# Patient Record
Sex: Male | Born: 1960 | Race: White | Hispanic: No | Marital: Married | Smoking: Former smoker
Health system: Southern US, Community
[De-identification: ages and names within clinical notes are randomized; demographics above are authoritative.]

## PROBLEM LIST (undated history)

## (undated) DIAGNOSIS — I10 Essential (primary) hypertension: Secondary | ICD-10-CM

## (undated) DIAGNOSIS — E119 Type 2 diabetes mellitus without complications: Secondary | ICD-10-CM

## (undated) DIAGNOSIS — I4891 Unspecified atrial fibrillation: Secondary | ICD-10-CM

## (undated) DIAGNOSIS — K219 Gastro-esophageal reflux disease without esophagitis: Secondary | ICD-10-CM

## (undated) DIAGNOSIS — E039 Hypothyroidism, unspecified: Secondary | ICD-10-CM

## (undated) HISTORY — PX: COLONOSCOPY: SHX174

## (undated) HISTORY — PX: UMBILICAL HERNIA REPAIR: SHX196

---

## 2013-04-28 DIAGNOSIS — I4891 Unspecified atrial fibrillation: Secondary | ICD-10-CM

## 2013-04-28 HISTORY — DX: Unspecified atrial fibrillation: I48.91

## 2017-12-13 ENCOUNTER — Emergency Department (HOSPITAL_COMMUNITY)
Admission: EM | Admit: 2017-12-13 | Discharge: 2017-12-14 | Disposition: A | Discharge status: Home / Self Care | Payer: BLUE CROSS/BLUE SHIELD | Attending: Emergency Medicine | Admitting: Emergency Medicine

## 2017-12-13 ENCOUNTER — Encounter (HOSPITAL_COMMUNITY): Payer: Self-pay | Admitting: Emergency Medicine

## 2017-12-13 ENCOUNTER — Emergency Department (HOSPITAL_COMMUNITY): Payer: BLUE CROSS/BLUE SHIELD

## 2017-12-13 ENCOUNTER — Other Ambulatory Visit: Payer: Self-pay

## 2017-12-13 DIAGNOSIS — K222 Esophageal obstruction: Secondary | ICD-10-CM | POA: Diagnosis not present

## 2017-12-13 DIAGNOSIS — K269 Duodenal ulcer, unspecified as acute or chronic, without hemorrhage or perforation: Secondary | ICD-10-CM | POA: Diagnosis not present

## 2017-12-13 DIAGNOSIS — I10 Essential (primary) hypertension: Secondary | ICD-10-CM | POA: Diagnosis not present

## 2017-12-13 DIAGNOSIS — K449 Diaphragmatic hernia without obstruction or gangrene: Secondary | ICD-10-CM | POA: Diagnosis not present

## 2017-12-13 DIAGNOSIS — E119 Type 2 diabetes mellitus without complications: Secondary | ICD-10-CM | POA: Insufficient documentation

## 2017-12-13 DIAGNOSIS — Z87891 Personal history of nicotine dependence: Secondary | ICD-10-CM | POA: Diagnosis not present

## 2017-12-13 DIAGNOSIS — K210 Gastro-esophageal reflux disease with esophagitis: Secondary | ICD-10-CM | POA: Insufficient documentation

## 2017-12-13 DIAGNOSIS — I482 Chronic atrial fibrillation: Secondary | ICD-10-CM | POA: Diagnosis not present

## 2017-12-13 DIAGNOSIS — T18128A Food in esophagus causing other injury, initial encounter: Secondary | ICD-10-CM | POA: Diagnosis not present

## 2017-12-13 DIAGNOSIS — Z880 Allergy status to penicillin: Secondary | ICD-10-CM | POA: Diagnosis not present

## 2017-12-13 DIAGNOSIS — E039 Hypothyroidism, unspecified: Secondary | ICD-10-CM | POA: Diagnosis not present

## 2017-12-13 DIAGNOSIS — K2980 Duodenitis without bleeding: Secondary | ICD-10-CM | POA: Diagnosis not present

## 2017-12-13 DIAGNOSIS — Z7984 Long term (current) use of oral hypoglycemic drugs: Secondary | ICD-10-CM | POA: Insufficient documentation

## 2017-12-13 DIAGNOSIS — T17200A Unspecified foreign body in pharynx causing asphyxiation, initial encounter: Secondary | ICD-10-CM | POA: Diagnosis not present

## 2017-12-13 DIAGNOSIS — Z882 Allergy status to sulfonamides status: Secondary | ICD-10-CM | POA: Insufficient documentation

## 2017-12-13 DIAGNOSIS — R1310 Dysphagia, unspecified: Secondary | ICD-10-CM | POA: Diagnosis not present

## 2017-12-13 HISTORY — DX: Type 2 diabetes mellitus without complications: E11.9

## 2017-12-13 HISTORY — DX: Unspecified atrial fibrillation: I48.91

## 2017-12-13 HISTORY — DX: Hypothyroidism, unspecified: E03.9

## 2017-12-13 HISTORY — DX: Essential (primary) hypertension: I10

## 2017-12-13 HISTORY — DX: Gastro-esophageal reflux disease without esophagitis: K21.9

## 2017-12-13 MED ORDER — DIAZEPAM 5 MG/ML IJ SOLN
2.5000 mg | Freq: Once | INTRAMUSCULAR | Status: AC
  Administered 2017-12-14: 2.5 mg via INTRAVENOUS
  Filled 2017-12-13: qty 2

## 2017-12-13 MED ORDER — GLUCAGON HCL RDNA (DIAGNOSTIC) 1 MG IJ SOLR
1.0000 mg | Freq: Once | INTRAMUSCULAR | Status: AC
Start: 2017-12-14 — End: 2017-12-14
  Administered 2017-12-14: 1 mg via INTRAVENOUS
  Filled 2017-12-13: qty 1

## 2017-12-13 NOTE — ED Provider Notes (Signed)
[facility] EMERGENCY DEPARTMENT Provider Note   CSN: [id number] Arrival date & time: 12/13/17  2200     History   Chief Complaint Chief Complaint  Patient presents with  . Foreign Body    HPI Eric Vaughn is a 57 y.o. male.  Patient believes he has a piece of meat stuck in his esophagus.  This happened around 8 PM.  Denies any choking sensation.  Initially he was not able to swallow his spit but now he is able to.  Did have one episode of forced emesis.  No difficulty breathing or chest pain.  History of this happening in the past but is never required a trip to the hospital.  He is never seen a GI doctor had an endoscopy.  He has a history of A. fib and diabetes but is not on anticoagulation.  Denies any difficulty breathing or swallowing at this time.  Still feels foreign body sensation in his mid chest.  The history is provided by the patient.  Foreign Body  Pertinent negatives include no fever, no congestion, no cough, no chest pain, no vomiting and no abdominal pain.    Past Medical History:  Diagnosis Date  . Atrial fibrillation (HCC) 2015  . Diabetes mellitus without complication (HCC)   . Hypertension     There are no active problems to display for this patient.   History reviewed. No pertinent surgical history.      Home Medications    Prior to Admission medications   Medication Sig Start Date End Date Taking? Authorizing Provider  aspirin 81 MG chewable tablet Chew by mouth daily.   Yes [provider]  lisinopril (PRINIVIL,ZESTRIL) 20 MG tablet Take 20 mg by mouth 2 (two) times daily.   Yes [provider]  metFORMIN (GLUCOPHAGE) 500 MG tablet Take by mouth 3 (three) times daily before meals.   Yes [provider]  metoprolol succinate (TOPROL-XL) 50 MG 24 hr tablet Take 50 mg by mouth 2 (two) times daily. Take with or immediately following a meal.   Yes [provider]  NIFEdipine (PROCARDIA-XL/ADALAT CC) 30 MG 24  hr tablet Take 30 mg by mouth daily.   Yes [provider]  omeprazole (PRILOSEC) 20 MG capsule Take 20 mg by mouth daily as needed.   Yes [provider]  PARoxetine (PAXIL) 10 MG tablet Take 10 mg by mouth daily.   Yes [provider]    Family History History reviewed. No pertinent family history.  Social History Social History   Tobacco Use  . Smoking status: Former Games developer  . Smokeless tobacco: Current User    Types: Snuff  Substance Use Topics  . Alcohol use: Yes    Comment: social   . Drug use: Never     Allergies   Penicillins and Sulfa antibiotics   Review of Systems Review of Systems  Constitutional: Negative for activity change, appetite change and fever.  HENT: Negative for congestion and rhinorrhea.   Respiratory: Negative for cough, chest tightness and shortness of breath.   Cardiovascular: Negative for chest pain.  Gastrointestinal: Negative for abdominal pain, nausea and vomiting.  Genitourinary: Negative for dysuria, hematuria and urgency.  Musculoskeletal: Negative for arthralgias and myalgias.  Skin: Negative for rash.  Neurological: Negative for dizziness, weakness and light-headedness.   all other systems are negative except as noted in the HPI and PMH.    Physical Exam Updated Vital Signs BP (!) 175/95 (BP Location: Right Arm)   Pulse 70  Temp 98.1 F (36.7 C) (Oral)   Resp 18   Ht 6' (1.829 m)   Wt 86.2 kg   SpO2 100%   BMI 25.77 kg/m   Physical Exam  Constitutional: He is oriented to person, place, and time. He appears well-developed and well-nourished. No distress.  No distress, speaking in full sentences  HENT:  Head: Normocephalic and atraumatic.  Mouth/Throat: Oropharynx is clear and moist. No oropharyngeal exudate.  Controlling secretion. No drooling  Eyes: Pupils are equal, round, and reactive to light. Conjunctivae and EOM are normal.  Neck: Normal range of motion. Neck supple.  No meningismus.    Cardiovascular: Normal rate, regular rhythm, normal heart sounds and intact distal pulses.  No murmur heard. Pulmonary/Chest: Effort normal and breath sounds normal. No respiratory distress. He exhibits no tenderness.  Abdominal: Soft. There is no tenderness. There is no rebound and no guarding.  Musculoskeletal: Normal range of motion. He exhibits no edema or tenderness.  Neurological: He is alert and oriented to person, place, and time. No cranial nerve deficit. He exhibits normal muscle tone. Coordination normal.  No ataxia on finger to nose bilaterally. No pronator drift. 5/5 strength throughout. CN 2-12 intact.Equal grip strength. Sensation intact.   Skin: Skin is warm. Capillary refill takes less than 2 seconds. No rash noted.  Psychiatric: He has a normal mood and affect. His behavior is normal.  Nursing note and vitals reviewed.    ED Treatments / Results  Labs (all labs ordered are listed, but only abnormal results are displayed) Labs Reviewed  BASIC METABOLIC PANEL - Abnormal; Notable for the following components:      Result Value   Glucose, Bld 205 (*)    All other components within normal limits  CBC WITH DIFFERENTIAL/PLATELET  TROPONIN I    EKG EKG Interpretation  Date/Time:  Monday December 14 2017 00:29:28 EDT Ventricular Rate:  69 PR Interval:    QRS Duration: 85 QT Interval:  399 QTC Calculation: 428 R Axis:   40 Text Interpretation:  Sinus rhythm Borderline repolarization abnormality No previous ECGs available Confirmed by Glynn Octaveancour, Kaleigh Spiegelman [id number]) on 12/14/2017 12:37:29 AM   Radiology Dg Neck Soft Tissue  Result Date: 12/13/2017 CLINICAL DATA:  Initial evaluation for foreign body in throat. EXAM: NECK SOFT TISSUES - 1+ VIEW COMPARISON:  None. FINDINGS: There is no evidence of retropharyngeal soft tissue swelling or epiglottic enlargement. The cervical airway is unremarkable and no radio-opaque foreign body identified. Degenerative spondylolysis noted at  C5-6. IMPRESSION: Negative.  No radiopaque foreign body identified. Electronically Signed   By: Rise MuBenjamin  McClintock M.D.   On: 12/13/2017 23:36   Dg Chest 2 View  Result Date: 12/13/2017 CLINICAL DATA:  Initial evaluation for possible foreign body in throat. EXAM: CHEST - 2 VIEW COMPARISON:  None. FINDINGS: The cardiac and mediastinal silhouettes are stable in size and contour, and remain within normal limits. No appreciable radiopaque foreign body. No significant esophageal dilatation. The lungs are normally inflated. No airspace consolidation, pleural effusion, or pulmonary edema is identified. There is no pneumothorax. No acute osseous abnormality identified. IMPRESSION: 1. No radiopaque foreign body seen within the thorax. 2. No other active cardiopulmonary disease. Electronically Signed   By: Rise MuBenjamin  McClintock M.D.   On: 12/13/2017 23:34    Procedures Procedures (including critical care time)  Medications Ordered in ED Medications  glucagon (human recombinant) (GLUCAGEN) injection 1 mg (has no administration in time range)  diazepam (VALIUM) injection 2.5 mg (has no administration in time range)  Initial Impression / Assessment and Plan / ED Course  I have reviewed the triage vital signs and the nursing notes.  Pertinent labs & imaging results that were available during my care of the patient were reviewed by me and considered in my medical decision making (see chart for details).  Clinical Course as of Dec 14 809  Mon Dec 14, 2017  [id number] EGD food bolus   [MB]    Clinical Course User Index [MB] Sabas SousBero, Michael M, MD   Patient with possible food impaction.  There is no difficulty swallowing his secretions.  There is no stridor.  He is speaking in full sentences.  X-rays show no radiopaque foreign body. Will attempt glucagon and Valium.  Patient reports no change after glucagon and Valium.  Attempts to swallow liquid which comes back up immediately. He is able to control  his secretions. Labs reviewed and show hyperglycemia is likely due to glucagon use and recent food intake but will need PCP follow-up.  Case discussed with Dr. Jena Gaussourk of gastroenterology who will arrange for EGD later today. Patient resting comfortably with patent airway.  Final Clinical Impressions(s) / ED Diagnoses   Final diagnoses:  Food impaction of esophagus, initial encounter    ED Discharge Orders    None       Swayze Pries, Jeannett SeniorStephen, MD 12/14/17 [phone]

## 2017-12-14 ENCOUNTER — Emergency Department (HOSPITAL_COMMUNITY): Payer: BLUE CROSS/BLUE SHIELD | Admitting: Anesthesiology

## 2017-12-14 ENCOUNTER — Encounter (HOSPITAL_COMMUNITY): Payer: Self-pay | Admitting: Gastroenterology

## 2017-12-14 ENCOUNTER — Encounter (HOSPITAL_COMMUNITY)
Admission: EM | Disposition: A | Discharge status: Home / Self Care | Payer: Self-pay | Source: Home / Self Care | Attending: Emergency Medicine

## 2017-12-14 ENCOUNTER — Other Ambulatory Visit: Payer: Self-pay

## 2017-12-14 DIAGNOSIS — T18128A Food in esophagus causing other injury, initial encounter: Secondary | ICD-10-CM | POA: Diagnosis not present

## 2017-12-14 DIAGNOSIS — K210 Gastro-esophageal reflux disease with esophagitis: Secondary | ICD-10-CM | POA: Diagnosis not present

## 2017-12-14 DIAGNOSIS — K222 Esophageal obstruction: Secondary | ICD-10-CM | POA: Diagnosis not present

## 2017-12-14 DIAGNOSIS — K2980 Duodenitis without bleeding: Secondary | ICD-10-CM | POA: Diagnosis not present

## 2017-12-14 DIAGNOSIS — W44F3XA Food entering into or through a natural orifice, initial encounter: Secondary | ICD-10-CM | POA: Insufficient documentation

## 2017-12-14 DIAGNOSIS — R1310 Dysphagia, unspecified: Secondary | ICD-10-CM

## 2017-12-14 DIAGNOSIS — K269 Duodenal ulcer, unspecified as acute or chronic, without hemorrhage or perforation: Secondary | ICD-10-CM | POA: Diagnosis not present

## 2017-12-14 DIAGNOSIS — K2210 Ulcer of esophagus without bleeding: Secondary | ICD-10-CM | POA: Diagnosis not present

## 2017-12-14 DIAGNOSIS — K449 Diaphragmatic hernia without obstruction or gangrene: Secondary | ICD-10-CM | POA: Diagnosis not present

## 2017-12-14 HISTORY — PX: MALONEY DILATION: SHX5535

## 2017-12-14 HISTORY — PX: ESOPHAGOGASTRODUODENOSCOPY (EGD) WITH PROPOFOL: SHX5813

## 2017-12-14 HISTORY — PX: BIOPSY: SHX5522

## 2017-12-14 LAB — BASIC METABOLIC PANEL
Anion gap: 8 (ref 5–15)
BUN: 17 mg/dL (ref 6–20)
CHLORIDE: 103 mmol/L (ref 98–111)
CO2: 28 mmol/L (ref 22–32)
Calcium: 9.4 mg/dL (ref 8.9–10.3)
Creatinine, Ser: 1.03 mg/dL (ref 0.61–1.24)
GFR calc Af Amer: >60 mL/min (ref >60)
GFR calc non Af Amer: >60 mL/min (ref >60)
Glucose, Bld: 205 mg/dL — ABNORMAL HIGH (ref 70–99)
POTASSIUM: 4.3 mmol/L (ref 3.5–5.1)
SODIUM: 139 mmol/L (ref 135–145)

## 2017-12-14 LAB — CBC WITH DIFFERENTIAL/PLATELET
Basophils Absolute: 0 10*3/uL (ref 0.0–0.1)
Basophils Relative: 0 %
EOS ABS: 0.1 10*3/uL (ref 0.0–0.7)
Eosinophils Relative: 1 %
HEMATOCRIT: 45.4 % (ref 39.0–52.0)
HEMOGLOBIN: 15.7 g/dL (ref 13.0–17.0)
LYMPHS PCT: 20 %
Lymphs Abs: 1.5 10*3/uL (ref 0.7–4.0)
MCH: 31 pg (ref 26.0–34.0)
MCHC: 34.6 g/dL (ref 30.0–36.0)
MCV: 89.7 fL (ref 78.0–100.0)
MONOS PCT: 6 %
Monocytes Absolute: 0.5 10*3/uL (ref 0.1–1.0)
NEUTROS ABS: 5.7 10*3/uL (ref 1.7–7.7)
NEUTROS PCT: 73 %
Platelets: 163 10*3/uL (ref 150–400)
RBC: 5.06 MIL/uL (ref 4.22–5.81)
RDW: 13 % (ref 11.5–15.5)
WBC: 7.8 10*3/uL (ref 4.0–10.5)

## 2017-12-14 LAB — TROPONIN I

## 2017-12-14 LAB — GLUCOSE, CAPILLARY: GLUCOSE-CAPILLARY: 133 mg/dL — AB (ref 70–99)

## 2017-12-14 SURGERY — ESOPHAGOGASTRODUODENOSCOPY (EGD) WITH PROPOFOL
Anesthesia: Monitor Anesthesia Care

## 2017-12-14 MED ORDER — ONDANSETRON HCL 4 MG/2ML IJ SOLN
INTRAMUSCULAR | Status: DC | PRN
  Administered 2017-12-14: 4 mg via INTRAVENOUS

## 2017-12-14 MED ORDER — MEPERIDINE HCL 100 MG/ML IJ SOLN: 6.2500 mg | INTRAMUSCULAR | Status: DC | PRN

## 2017-12-14 MED ORDER — STERILE WATER FOR INJECTION IJ SOLN
INTRAMUSCULAR | Status: AC
  Filled 2017-12-14: qty 10

## 2017-12-14 MED ORDER — LACTATED RINGERS IV SOLN
INTRAVENOUS | Status: DC
  Administered 2017-12-14: 1000 mL via INTRAVENOUS

## 2017-12-14 MED ORDER — MIDAZOLAM HCL 5 MG/5ML IJ SOLN
INTRAMUSCULAR | Status: DC | PRN
  Administered 2017-12-14: 2 mg via INTRAVENOUS

## 2017-12-14 MED ORDER — HYDROMORPHONE HCL 1 MG/ML IJ SOLN: 0.2500 mg | INTRAMUSCULAR | Status: DC | PRN

## 2017-12-14 MED ORDER — LACTATED RINGERS IV SOLN: INTRAVENOUS | Status: DC

## 2017-12-14 MED ORDER — PROPOFOL 500 MG/50ML IV EMUL
INTRAVENOUS | Status: DC | PRN
  Administered 2017-12-14: 135 ug/kg/min via INTRAVENOUS

## 2017-12-14 MED ORDER — PROMETHAZINE HCL 25 MG/ML IJ SOLN: 6.2500 mg | INTRAMUSCULAR | Status: DC | PRN

## 2017-12-14 MED ORDER — HYDROCODONE-ACETAMINOPHEN 7.5-325 MG PO TABS: 1.0000 | ORAL_TABLET | Freq: Once | ORAL | Status: DC | PRN

## 2017-12-14 NOTE — Addendum Note (Signed)
Addendum  created 12/14/17 1000 by Despina HiddenIdacavage, Ronell Boldin J, CRNA   Charge Capture section accepted

## 2017-12-14 NOTE — Transfer of Care (Signed)
Immediate Anesthesia Transfer of Care Note  Patient: Eric Vaughn  Procedure(s) Performed: ESOPHAGOGASTRODUODENOSCOPY (EGD) WITH PROPOFOL (N/A ) MALONEY DILATION BIOPSY  Patient Location: PACU  Anesthesia Type:MAC  Level of Consciousness: drowsy  Airway & Oxygen Therapy: Patient Spontanous Breathing and Patient connected to nasal cannula oxygen  Post-op Assessment: Report given to RN, Post -op Vital signs reviewed and stable and Patient moving all extremities  Post vital signs: Reviewed and stable  Last Vitals:  Vitals Value Taken Time  BP    Temp    Pulse    Resp    SpO2      Last Pain:  Vitals:   12/14/17 0853  TempSrc:   PainSc: 0-No pain         Complications: No apparent anesthesia complications

## 2017-12-14 NOTE — Discharge Instructions (Signed)
EGD Discharge instructions Please read the instructions outlined below and refer to this sheet in the next few weeks. These discharge instructions provide you with general information on caring for yourself after you leave the hospital. Your doctor may also give you specific instructions. While your treatment has been planned according to the most current medical practices available, unavoidable complications occasionally occur. If you have any problems or questions after discharge, please call your doctor. ACTIVITY  You may resume your regular activity but move at a slower pace for the next 24 hours.   Take frequent rest periods for the next 24 hours.   Walking will help expel (get rid of) the air and reduce the bloated feeling in your abdomen.   No driving for 24 hours (because of the anesthesia (medicine) used during the test).   You may shower.   Do not sign any important legal documents or operate any machinery for 24 hours (because of the anesthesia used during the test).  NUTRITION  Drink plenty of fluids.   You may resume your normal diet.   Begin with a light meal and progress to your normal diet.   Avoid alcoholic beverages for 24 hours or as instructed by your caregiver.  MEDICATIONS  You may resume your normal medications unless your caregiver tells you otherwise.  WHAT YOU CAN EXPECT TODAY  You may experience abdominal discomfort such as a feeling of fullness or gas pains.  FOLLOW-UP  Your doctor will discuss the results of your test with you.  SEEK IMMEDIATE MEDICAL ATTENTION IF ANY OF THE FOLLOWING OCCUR:  Excessive nausea (feeling sick to your stomach) and/or vomiting.   Severe abdominal pain and distention (swelling).   Trouble swallowing.   Temperature over 101 F (37.8 C).   Rectal bleeding or vomiting of blood.   GERD information provided  Stop omeprazole;  begin Protonix 40 mg twice daily  Office visit with [place] in 3 months    Further  recommendations to follow pending review of pathology report    Gastroesophageal Reflux Disease, Adult Normally, food travels down the esophagus and stays in the stomach to be digested. If a person has gastroesophageal reflux disease (GERD), food and stomach acid move back up into the esophagus. When this happens, the esophagus becomes sore and swollen (inflamed). Over time, GERD can make small holes (ulcers) in the lining of the esophagus. Follow these instructions at home: Diet  Follow a diet as told by your doctor. You may need to avoid foods and drinks such as: ? Coffee and tea (with or without caffeine). ? Drinks that contain alcohol. ? Energy drinks and sports drinks. ? Carbonated drinks or sodas. ? Chocolate and cocoa. ? Peppermint and mint flavorings. ? Garlic and onions. ? Horseradish. ? Spicy and acidic foods, such as peppers, chili powder, curry powder, vinegar, hot sauces, and BBQ sauce. ? Citrus fruit juices and citrus fruits, such as oranges, lemons, and limes. ? Tomato-based foods, such as red sauce, chili, salsa, and pizza with red sauce. ? Fried and fatty foods, such as donuts, french fries, potato chips, and high-fat dressings. ? High-fat meats, such as hot dogs, rib eye steak, sausage, ham, and bacon. ? High-fat dairy items, such as whole milk, butter, and cream cheese.  Eat small meals often. Avoid eating large meals.  Avoid drinking large amounts of liquid with your meals.  Avoid eating meals during the 2-3 hours before bedtime.  Avoid lying down right after you eat.  Do not exercise  right after you eat. General instructions  Pay attention to any changes in your symptoms.  Take over-the-counter and prescription medicines only as told by your doctor. Do not take aspirin, ibuprofen, or other NSAIDs unless your doctor says it is okay.  Do not use any tobacco products, including cigarettes, chewing tobacco, and e-cigarettes. If you need help quitting, ask your  doctor.  Wear loose clothes. Do not wear anything tight around your waist.  Raise (elevate) the head of your bed about 6 inches (15 cm).  Try to lower your stress. If you need help doing this, ask your doctor.  If you are overweight, lose an amount of weight that is healthy for you. Ask your doctor about a safe weight loss goal.  Keep all follow-up visits as told by your doctor. This is important. Contact a doctor if:  You have new symptoms.  You lose weight and you do not know why it is happening.  You have trouble swallowing, or it hurts to swallow.  You have wheezing or a cough that keeps happening.  Your symptoms do not get better with treatment.  You have a hoarse voice. Get help right away if:  You have pain in your arms, neck, jaw, teeth, or back.  You feel sweaty, dizzy, or light-headed.  You have chest pain or shortness of breath.  You throw up (vomit) and your throw up looks like blood or coffee grounds.  You pass out (faint).  Your poop (stool) is bloody or black.  You cannot swallow, drink, or eat. This information is not intended to replace advice given to you by your health care provider. Make sure you discuss any questions you have with your health care provider. Document Released: 10/01/2007 Document Revised: 09/20/2015 Document Reviewed: 08/09/2014 Elsevier Interactive Patient Education  2018 Elsevier Inc[employer]     Monitored Anesthesia Care, Care After These instructions provide you with information about caring for yourself after your procedure. Your health care provider may also give you more specific instructions. Your treatment has been planned according to current medical practices, but problems sometimes occur. Call your health care provider if you have any problems or questions after your procedure. What can I expect after the procedure? After your procedure, it is common to:  Feel sleepy for several hours.  Feel clumsy and have poor balance  for several hours.  Feel forgetful about what happened after the procedure.  Have poor judgment for several hours.  Feel nauseous or vomit.  Have a sore throat if you had a breathing tube during the procedure.  Follow these instructions at home: For at least 24 hours after the procedure:   Do not: ? Participate in activities in which you could fall or become injured. ? Drive. ? Use heavy machinery. ? Drink alcohol. ? Take sleeping pills or medicines that cause drowsiness. ? Make important decisions or sign legal documents. ? Take care of children on your own.  Rest. Eating and drinking  Follow the diet that is recommended by your health care provider.  If you vomit, drink water, juice, or soup when you can drink without vomiting.  Make sure you have little or no nausea before eating solid foods. General instructions  Have a responsible adult stay with you until you are awake and alert.  Take over-the-counter and prescription medicines only as told by your health care provider.  If you smoke, do not smoke without supervision.  Keep all follow-up visits as told by your health care provider.  This is important. Contact a health care provider if:  You keep feeling nauseous or you keep vomiting.  You feel light-headed.  You develop a rash.  You have a fever. Get help right away if:  You have trouble breathing. This information is not intended to replace advice given to you by your health care provider. Make sure you discuss any questions you have with your health care provider. Document Released: 08/05/2015 Document Revised: 12/05/2015 Document Reviewed: 08/05/2015 Elsevier Interactive Patient Education  [employer]2018 [employer] Inc.

## 2017-12-14 NOTE — Anesthesia Preprocedure Evaluation (Signed)
Anesthesia Evaluation  Patient identified by MRN, date of birth, ID band Patient awake    Reviewed: Allergy & Precautions, H&P , NPO status , Patient's Chart, lab work & pertinent test results, reviewed documented beta blocker date and time   Airway Mallampati: II  TM Distance: >3 FB Neck ROM: full    Dental no notable dental hx. (+) Teeth Intact   Pulmonary neg pulmonary ROS, former smoker,    Pulmonary exam normal breath sounds clear to auscultation       Cardiovascular Exercise Tolerance: Good hypertension, negative cardio ROS   Rhythm:regular Rate:Normal     Neuro/Psych negative neurological ROS  negative psych ROS   GI/Hepatic negative GI ROS, Neg liver ROS, GERD  ,  Endo/Other  negative endocrine ROSdiabetesHypothyroidism   Renal/GU negative Renal ROS  negative genitourinary   Musculoskeletal   Abdominal   Peds  Hematology negative hematology ROS (+)   Anesthesia Other Findings Posted emergency for food bolus. Seen in no distress, not drooling, swallows, pleasant and converses normally  Reproductive/Obstetrics negative OB ROS                             Anesthesia Physical Anesthesia Plan  ASA: II and emergent  Anesthesia Plan: MAC   Post-op Pain Management:    Induction:   PONV Risk Score and Plan:   Airway Management Planned:   Additional Equipment:   Intra-op Plan:   Post-operative Plan:   Informed Consent: I have reviewed the patients History and Physical, chart, labs and discussed the procedure including the risks, benefits and alternatives for the proposed anesthesia with the patient or authorized representative who has indicated his/her understanding and acceptance.   Dental Advisory Given  Plan Discussed with: CRNA and Anesthesiologist  Anesthesia Plan Comments:         Anesthesia Quick Evaluation

## 2017-12-14 NOTE — Anesthesia Postprocedure Evaluation (Signed)
Anesthesia Post Note  Patient: Futures trader  Procedure(s) Performed: ESOPHAGOGASTRODUODENOSCOPY (EGD) WITH PROPOFOL (N/A ) [place]  Patient location during evaluation: PACU Anesthesia Type: MAC Level of consciousness: awake and alert and patient cooperative Pain management: pain level controlled Vital Signs Assessment: post-procedure vital signs reviewed and stable Respiratory status: spontaneous breathing, nonlabored ventilation and respiratory function stable Cardiovascular status: blood pressure returned to baseline Postop Assessment: no apparent nausea or vomiting Anesthetic complications: no     Last Vitals:  Vitals:   12/14/17 0845 12/14/17 0920  BP:  139/87  Pulse:  72  Resp: 13 12  Temp:  (P) 36.5 C  SpO2: 99% 100%    Last Pain:  Vitals:   12/14/17 0853  TempSrc:   PainSc: 0-No pain                 Mikaylee Arseneau J

## 2017-12-14 NOTE — H&P (Signed)
Referring Provider: Dr. Manus Gunningancour, [facility] Primary Care Physician:  Garald BraverElliott, Dianne E Primary Gastroenterologist:  Previously Dr. Elder CyphersShiflett. Desires to transfer care to Dr. Jena Gaussourk.   HPI:  Eric Vaughn is a 57 y.o. year old male presenting to the ED yesterday evening with food impaction. Notes cooking on the grill yesterday evening with son and swallowed a large piece of pork rib. Knew immediately it was lodged. Initially was not able to tolerate secretions but received glucagon and Valium in the ED. Now tolerating secretions but unable to tolerate water. Negative soft tissue xray. Negative CXR.   Feels food lodged in lower chest. Wife states he has felt solid food hung before and has to walk around, jump around, passes on own. States never regurgitated before. Wife states he takes Tums every night but. Omeprazole once or twice a month. Chronic GERD with intermittent vague solid food dysphagia. Not on daily PPI.   No prior EGD. Numerous colonoscopies by Dr. Elder CyphersShiflett.due to family history of colon cancer in father, who passed away age 58. Last colonoscopy approximately 1-2 years ago.    Hx of afib, no anticoagulation. On 81 mg aspirin.   Past Medical History:  Diagnosis Date  . Atrial fibrillation (HCC) 2015  . Diabetes mellitus without complication (HCC)   . GERD (gastroesophageal reflux disease)   . Hypertension   . Hypothyroidism     Past Surgical History:  Procedure Laterality Date  . COLONOSCOPY     multiple due to family history of colon cancer. Dr. Elder CyphersShiflett. colonoscopy possibly around 2017//2018      Synthroid 25 mcg daily   Prior to Admission medications   Medication Sig Start Date End Date Taking? Authorizing Provider  aspirin 81 MG chewable tablet Chew by mouth daily.   Yes [provider]  lisinopril (PRINIVIL,ZESTRIL) 20 MG tablet Take 20 mg by mouth 2 (two) times daily.   Yes [provider]  metFORMIN (GLUCOPHAGE) 500 MG tablet Take by mouth 3  (three) times daily before meals.   Yes [provider]  metoprolol succinate (TOPROL-XL) 50 MG 24 hr tablet Take 50 mg by mouth 2 (two) times daily. Take with or immediately following a meal.   Yes [provider]  NIFEdipine (PROCARDIA-XL/ADALAT CC) 30 MG 24 hr tablet Take 30 mg by mouth daily.   Yes [provider]  omeprazole (PRILOSEC) 20 MG capsule Take 20 mg by mouth daily as needed.   Yes [provider]  PARoxetine (PAXIL) 10 MG tablet Take 10 mg by mouth daily.   Yes [provider]    No current facility-administered medications for this encounter.    Current Outpatient Medications  Medication Sig Dispense Refill  . aspirin 81 MG chewable tablet Chew by mouth daily.    Marland Kitchen. lisinopril (PRINIVIL,ZESTRIL) 20 MG tablet Take 20 mg by mouth 2 (two) times daily.    . metFORMIN (GLUCOPHAGE) 500 MG tablet Take by mouth 3 (three) times daily before meals.    . metoprolol succinate (TOPROL-XL) 50 MG 24 hr tablet Take 50 mg by mouth 2 (two) times daily. Take with or immediately following a meal.    . NIFEdipine (PROCARDIA-XL/ADALAT CC) 30 MG 24 hr tablet Take 30 mg by mouth daily.    Marland Kitchen. omeprazole (PRILOSEC) 20 MG capsule Take 20 mg by mouth daily as needed.    Marland Kitchen. PARoxetine (PAXIL) 10 MG tablet Take 10 mg by mouth daily.      Allergies as of 12/13/2017 - Review Complete 12/13/2017  Allergen Reaction Noted  . Penicillins Rash 12/13/2017  . Sulfa antibiotics Rash 12/13/2017    Family History  Problem Relation Age of Onset  . Colon cancer Father        deceased age 58    Social History   Socioeconomic History  . Marital status: Married    Spouse name: Not on file  . Number of children: Not on file  . Years of education: Not on file  . Highest education level: Not on file  Occupational History  . Not on file  Social Needs  . Financial resource strain: Not on file  . Food insecurity:    Worry: Not on file    Inability: Not on file  .  Transportation needs:    Medical: Not on file    Non-medical: Not on file  Tobacco Use  . Smoking status: Former Games developermoker  . Smokeless tobacco: Current User    Types: Snuff  Substance and Sexual Activity  . Alcohol use: Yes    Comment: social   . Drug use: Never  . Sexual activity: Not on file  Lifestyle  . Physical activity:    Days per week: Not on file    Minutes per session: Not on file  . Stress: Not on file  Relationships  . Social connections:    Talks on phone: Not on file    Gets together: Not on file    Attends religious service: Not on file    Active member of club or organization: Not on file    Attends meetings of clubs or organizations: Not on file    Relationship status: Not on file  . Intimate partner violence:    Fear of current or ex partner: Not on file    Emotionally abused: Not on file    Physically abused: Not on file    Forced sexual activity: Not on file  Other Topics Concern  . Not on file  Social History Narrative  . Not on file    Review of Systems: Gen: Denies fever, chills, loss of appetite, change in weight or weight loss CV: Denies chest pain, heart palpitations, syncope, edema  Resp: Denies shortness of breath with rest, cough, wheezing GI: see HPI  GU : Denies urinary burning, urinary frequency, urinary incontinence.  MS: Denies joint pain,swelling, cramping Derm: Denies rash, itching, dry skin Psych: Denies depression, anxiety,confusion, or memory loss Heme: Denies bruising, bleeding, and enlarged lymph nodes.  Physical Exam: Vital signs in last 24 hours: Temp:  [98.1 F (36.7 C)] 98.1 F (36.7 C) (08/18 2221) Pulse Rate:  [42-74] 62 (08/19 0600) Resp:  [11-21] 19 (08/19 0600) BP: (137-175)/(78-98) 175/98 (08/19 0600) SpO2:  [93 %-100 %] 99 % (08/19 0600) Weight:  [86.2 kg] 86.2 kg (08/18 2221)   General:   Alert,  Well-developed, well-nourished, pleasant and cooperative in NAD Head:  Normocephalic and atraumatic. Eyes:   Sclera clear, no icterus.   Conjunctiva pink. Ears:  Normal auditory acuity. Nose:  No deformity, discharge,  or lesions. Mouth:  No deformity or lesions, dentition normal. Lungs:  Clear throughout to auscultation.   No wheezes, crackles, or rhonchi. No acute distress. Heart:  S1 S2 present, irregularly irregular, no murmurs Abdomen:  Soft, nontender and nondistended. No masses, hepatosplenomegaly or hernias noted. Normal bowel sounds, without guarding, and without rebound.   Rectal:  Deferred  Msk:  Symmetrical without gross deformities. Normal posture. Extremities:  Without edema. Neurologic:  Alert and  oriented x4 Psych:  Alert and cooperative. Normal mood and affect.  Intake/Output from previous day: No intake/output data recorded. Intake/Output this shift: No intake/output data recorded.  Lab Results: Recent Labs    12/14/17 0108  WBC 7.8  HGB 15.7  HCT 45.4  PLT 163   BMET Recent Labs    12/14/17 0108  NA 139  K 4.3  CL 103  CO2 28  GLUCOSE 205*  BUN 17  CREATININE 1.03  CALCIUM 9.4    Studies/Results: Dg Neck Soft Tissue  Result Date: 12/13/2017 CLINICAL DATA:  Initial evaluation for foreign body in throat. EXAM: NECK SOFT TISSUES - 1+ VIEW COMPARISON:  None. FINDINGS: There is no evidence of retropharyngeal soft tissue swelling or epiglottic enlargement. The cervical airway is unremarkable and no radio-opaque foreign body identified. Degenerative spondylolysis noted at C5-6. IMPRESSION: Negative.  No radiopaque foreign body identified. Electronically Signed   By: Rise Mu M.D.   On: 12/13/2017 23:36   Dg Chest 2 View  Result Date: 12/13/2017 CLINICAL DATA:  Initial evaluation for possible foreign body in throat. EXAM: CHEST - 2 VIEW COMPARISON:  None. FINDINGS: The cardiac and mediastinal silhouettes are stable in size and contour, and remain within normal limits. No appreciable radiopaque foreign body. No significant esophageal dilatation. The  lungs are normally inflated. No airspace consolidation, pleural effusion, or pulmonary edema is identified. There is no pneumothorax. No acute osseous abnormality identified. IMPRESSION: 1. No radiopaque foreign body seen within the thorax. 2. No other active cardiopulmonary disease. Electronically Signed   By: Rise Mu M.D.   On: 12/13/2017 23:34    Assessment: 57 year old male with history of chronic GERD, intermittent vague solid food dysphagia, now presenting with food impaction after eating large piece of pork rib last night. Prior to ED presentation was not tolerating oral secretions; however, he now is tolerating secretions and in no distress. Glucagon and Valium given in ED. Xrays negative for foreign body. Unable to tolerate water. No prior EGD. No daily use of PPI.   EGD/dilation with Propofol arranged ASAP for this morning with Dr. Jena Gauss. I discussed in detail risks and benefits with patient and wife at bedside. As of note, no chronic anticoagulation. 81 mg aspirin daily.   Plan: Remain NPO EGD/dilation with Propofol by Dr. Jena Gauss. Desires to transfer care to [facility].   Gelene Mink, PhD, ANP-BC [facility]     LOS: 0 days    12/14/2017, 8:04 AM

## 2017-12-14 NOTE — Op Note (Signed)
[facility] Patient Name: Eric Vaughn Procedure Date: 12/14/2017 8:16 AM MRN: [id number] Date of Birth: [birth year] Attending MD: Gennette Pacobert Michael Rourk , MD CSN: [id number] Age: 57 Admit Type: Outpatient Procedure:                Upper GI endoscopy Indications:              Dysphagia/ possible food impaction Providers:                Gennette Pacobert Michael Rourk, MD, Loma MessingLurae B. Patsy LagerAlbert RN, RN,                            Burke Keelsrisann Tilley, Technician Referring MD:              Medicines:                Propofol per Anesthesia Complications:            No immediate complications. Estimated Blood Loss:     Estimated blood loss was minimal. Procedure:                Pre-Anesthesia Assessment:                           - Prior to the procedure, a History and Physical                            was performed, and patient medications and                            allergies were reviewed. The patient's tolerance of                            previous anesthesia was also reviewed. The risks                            and benefits of the procedure and the sedation                            options and risks were discussed with the patient.                            All questions were answered, and informed consent                            was obtained. Prior Anticoagulants: The patient has                            taken no previous anticoagulant or antiplatelet                            agents. ASA Grade Assessment: II - A patient with                            mild systemic disease. After reviewing the risks  and benefits, the patient was deemed in                            satisfactory condition to undergo the procedure.                           After obtaining informed consent, the endoscope was                            passed under direct vision. Throughout the                            procedure, the patient's blood pressure, pulse, and           oxygen saturations were monitored continuously. The                            [id number]-H190 ([id number]) scope was introduced through the                            and advanced to the second part of duodenum. The                            upper GI endoscopy was accomplished without                            difficulty. The patient tolerated the procedure                            well. 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       Tubular esophagus remain patent throughout its course. 2 broad tongues       of salmon-colored epithelium. No nodularity / tumor seen. A small hiatal       hernia was present.      No other significant abnormalities were identified in a careful       examination of the stomach.      multiple of bulbar NG tube esions. One erosion a good 2 mm x 8 mm in       dimensions. No oinfiltrative process.. 56 French Maloney dilator passed       full insertion with mild resistance. Look back revealed stricture had       been dilated without apparent clication. Estimateood loss?"minimal.       finally, biopsies of the distal esophag and duodenumaken for histologic       study Impression:               - Erosive/ulcerative reflux esophagitis. Mild                            peptic stricture?"status post dilation. Abnormal                            distal esophagus suspicious for short segment                            ?"  status post biopsy.                           Small hiatal hernia.                           - Duodenal erosion. status post biopy ; cannot                            excludod impaction.                           - Moderate Sedation:      Moderate (conscious) sedation was personally administered by an       anesthesia professional. The following parameters were monitored: oxygen       saturation, heart rate,  blood pressure, respiratory rate, EKG, adequacy       of pulmonary ventilation, and response to care. Total physician       intraservice time was 16 minutes. Recommendation:           - Patient has a contact number available for                            emergencies. The signs and symptoms of potential                            delayed complications were discussed with the                            patient. Return to normal activities tomorrow.                            Written discharge instructions were provided to the                            patient.                           - Resume previous diet.                           - Continue present medications. stop "when                            necessary" omeprazole; Protonix 40 mg orally twice                            daily                           - Await pathology results.                           - No repeat upper endoscopy.                           - Return to GI office in 12 weeks. Procedure Code(s):        ---  Professional ---                           [phone], Esophagogastroduodenoscopy, flexible,                            transoral; diagnostic, including collection of                            specimen(s) by brushing or washing, when performed                            (separate procedure) Diagnosis Code(s):        --- Professional ---                           K44.9, Diaphragmatic hernia without obstruction or                            gangrene                           K26.9, Duodenal ulcer, unspecified as acute or                            chronic, without hemorrhage or perforation                           R13.10, Dysphagia, unspecified CPT copyright 2017 American Medical Association. All rights reserved. The codes documented in this report are preliminary and upon coder review may  be revised to meet current compliance requirements. Gerrit Friendsobert M. Rourk, MD Gennette Pacobert Michael Rourk, MD 12/14/2017 9:23:36 AM This  report has been signed electronically. Number of Addenda: 0

## 2017-12-14 NOTE — ED Notes (Signed)
Consent form signed and pt taken to short stay at this time by ED staff

## 2017-12-14 NOTE — OR Nursing (Signed)
  December 14, 2017  Patient: Eric Vaughn  Date of Birth: [birth year]  Date of Visit: 12/13/2017    To Whom It May Concern:  Eric Vaughn was seen and treated in our emergency department or urgent care center  [facility] on 12/13/2017. Eric Vaughn  may return to work on 12/15/17.  Sincerely,

## 2017-12-21 ENCOUNTER — Encounter: Payer: Self-pay | Admitting: Internal Medicine

## 2017-12-24 ENCOUNTER — Encounter (HOSPITAL_COMMUNITY): Payer: Self-pay | Admitting: Internal Medicine

## 2018-03-19 ENCOUNTER — Ambulatory Visit (INDEPENDENT_AMBULATORY_CARE_PROVIDER_SITE_OTHER): Payer: BLUE CROSS/BLUE SHIELD | Admitting: Gastroenterology

## 2018-03-19 ENCOUNTER — Encounter: Payer: Self-pay | Admitting: Gastroenterology

## 2018-03-19 VITALS — BP 158/94 | HR 91 | Temp 97.1°F | Ht 72.0 in | Wt 199.4 lb

## 2018-03-19 DIAGNOSIS — T18128D Food in esophagus causing other injury, subsequent encounter: Secondary | ICD-10-CM

## 2018-03-19 DIAGNOSIS — K2100 Gastro-esophageal reflux disease with esophagitis, without bleeding: Secondary | ICD-10-CM

## 2018-03-19 DIAGNOSIS — K219 Gastro-esophageal reflux disease without esophagitis: Secondary | ICD-10-CM | POA: Insufficient documentation

## 2018-03-19 NOTE — Progress Notes (Signed)
Primary Care Physician: Garald BraverElliott, Dianne E  Primary Gastroenterologist:  Roetta SessionsMichael Rourk, MD   Chief Complaint  Patient presents with  . food impaction    pp f/u; doing ok    HPI: Eric Vaughn is a 57 y.o. male here for follow-up of recent food impaction.  He was seen on August 19 presenting to the ED with food impaction.  Has happened multiple times in the past as well.  Multiple bulbar erosions.  Pathology revealed inflammation in the esophagus and no Barrett's or tumor or H. pylori.  Patient takes pantoprazole 40 mg twice daily.  He admits that he was taking Prilosec about once per month before.  He has been feeling very well.  Dysphagia completely 100% improved.  No heartburn symptoms.  No abdominal pain.  Bowel function normal.  No blood in stool or melena.  He believes his last colonoscopy was in 2017, [place] in [place].  Advised to come back in 5 years due to family history of colon cancer.  He will try to obtain practice information so that we can get the records.  Current Outpatient Medications  Medication Sig Dispense Refill  . aspirin 81 MG chewable tablet Chew by mouth daily.    Marland Kitchen. levothyroxine (SYNTHROID, LEVOTHROID) 25 MCG tablet Take 1 tablet by mouth daily.    Marland Kitchen. lisinopril (PRINIVIL,ZESTRIL) 20 MG tablet Take 20 mg by mouth 2 (two) times daily.    . metFORMIN (GLUCOPHAGE) 500 MG tablet Take by mouth 3 (three) times daily before meals.    . metoprolol succinate (TOPROL-XL) 50 MG 24 hr tablet Take 50 mg by mouth 2 (two) times daily. Take with or immediately following a meal.    . NIFEdipine (PROCARDIA-XL/ADALAT CC) 30 MG 24 hr tablet Take 30 mg by mouth daily.    . pantoprazole (PROTONIX) 40 MG tablet Take 40 mg by mouth 2 (two) times daily.  3  . PARoxetine (PAXIL) 10 MG tablet Take 10 mg by mouth daily.     No current facility-administered medications for this visit.     Allergies as of 03/19/2018 - Review Complete 03/19/2018  Allergen Reaction Noted    . Penicillins Rash 12/13/2017  . Sulfa antibiotics Rash 12/13/2017    ROS:  General: Negative for anorexia, weight loss, fever, chills, fatigue, weakness. ENT: Negative for hoarseness, difficulty swallowing , nasal congestion. CV: Negative for chest pain, angina, palpitations, dyspnea on exertion, peripheral edema.  Respiratory: Negative for dyspnea at rest, dyspnea on exertion, cough, sputum, wheezing.  GI: See history of present illness. GU:  Negative for dysuria, hematuria, urinary incontinence, urinary frequency, nocturnal urination.  Endo: Negative for unusual weight change.    Physical Examination:   BP (!) 158/94   Pulse 91   Temp (!) 97.1 F (36.2 C) (Oral)   Ht 6' (1.829 m)   Wt 199 lb 6.4 oz (90.4 kg)   BMI 27.04 kg/m   General: Well-nourished, well-developed in no acute distress.  Eyes: No icterus. Mouth: Oropharyngeal mucosa moist and pink , no lesions erythema or exudate. Lungs: Clear to auscultation bilaterally.  Heart: Regular rate and rhythm, no murmurs rubs or gallops.  Abdomen: Bowel sounds are normal, nontender, nondistended, no hepatosplenomegaly or masses, no abdominal bruits or hernia , no rebound or guarding.   Extremities: No lower extremity edema. No clubbing or deformities. Neuro: Alert and oriented x 4   Skin: Warm and dry, no jaundice.   Psych: Alert and cooperative, normal mood and affect.

## 2018-03-19 NOTE — Patient Instructions (Signed)
1. Call back in with information regarding where your last colonoscopy was done so we can get your records.  2. Continue pantoprazole ONCE daily before breakfast. If you start having breakthrough symptoms, you can go back to twice daily.  3. Return to the office in two years or call sooner if needed.

## 2018-03-19 NOTE — Assessment & Plan Note (Signed)
57 year old gentleman with recent EGD for food impaction, found to have erosive/ulcerative reflux esophagitis with soft peptic stricture status post dilation.  Duodenitis as well.  Clinically he is doing much better.  Dysphagia resolved.  No reflux symptoms.  He will continue pantoprazole at 40 mg daily.  If he has any breakthrough reflux we can always go back to twice daily if needed.  Return in 2 years for follow-up.  He will obtain practice information from [place] facility where he had his last colonoscopy.  Try to retrieve records to determine when next colonoscopy is due.  He believes he is due in 2022.

## 2018-03-22 NOTE — Progress Notes (Signed)
CC'D TO PCP °

## 2018-04-12 ENCOUNTER — Telehealth: Payer: Self-pay | Admitting: *Deleted

## 2018-04-12 DIAGNOSIS — K2100 Gastro-esophageal reflux disease with esophagitis, without bleeding: Secondary | ICD-10-CM

## 2018-04-12 NOTE — Telephone Encounter (Signed)
Received refill request for pantoprazole 40mg  1 tablet BID

## 2018-04-14 MED ORDER — PANTOPRAZOLE SODIUM 40 MG PO TBEC: 40.0000 mg | DELAYED_RELEASE_TABLET | Freq: Two times a day (BID) | ORAL | 3 refills | Status: DC

## 2018-04-14 NOTE — Addendum Note (Signed)
Addended by: Delane GingerGILL, ERIC A on: 04/14/2018 04:31 PM   Modules accepted: Orders

## 2018-04-14 NOTE — Telephone Encounter (Signed)
Rx sent per request. 

## 2018-04-26 ENCOUNTER — Telehealth: Payer: Self-pay | Admitting: Internal Medicine

## 2018-04-26 DIAGNOSIS — K2100 Gastro-esophageal reflux disease with esophagitis, without bleeding: Secondary | ICD-10-CM

## 2018-04-26 MED ORDER — PANTOPRAZOLE SODIUM 40 MG PO TBEC: 40.0000 mg | DELAYED_RELEASE_TABLET | Freq: Two times a day (BID) | ORAL | 3 refills | Status: DC

## 2018-04-26 NOTE — Telephone Encounter (Signed)
Please see pharmacy change. Pt would like a refill.

## 2018-04-26 NOTE — Telephone Encounter (Signed)
90 day supply of pantoprozole needs to be sent to [facility] in [place]   Not going to [facility] anymore

## 2018-04-26 NOTE — Telephone Encounter (Signed)
Completed.

## 2018-04-26 NOTE — Addendum Note (Signed)
Addended by: Gelene MinkBOONE, ANNA W on: 04/26/2018 02:07 PM   Modules accepted: Orders

## 2018-07-07 ENCOUNTER — Telehealth: Payer: Self-pay | Admitting: Gastroenterology

## 2018-07-07 NOTE — Telephone Encounter (Signed)
Spoke with pt. He is going to call back when he has the info. Pt forgot to research info. Waiting on a return call.

## 2018-07-07 NOTE — Telephone Encounter (Signed)
Patient was supposed to call in with information regarding where his last colonoscopy was done or doctor's name in [place], [place] so we can get copy and determine when next one is due. Please follow up with him and try to get report.

## 2018-08-26 NOTE — Telephone Encounter (Signed)
Received records from prior colonoscopy dated January 2018  Impression diverticulosis of the sigmoid colon and at the a sending colon.  No specimens collected.  Recommended 5-year follow-up colonoscopy due to family history, father with colon cancer before the age of 64.  Please let patient know that he is due for colonoscopy in January 2023.    Please NIC colonoscopy in January 2023

## 2018-08-30 NOTE — Telephone Encounter (Signed)
ON RECALL  °

## 2018-08-30 NOTE — Telephone Encounter (Signed)
Pt notified of next TCS 04/2021. Please NIC TCS 04/2021 per LSL.

## 2019-07-10 IMAGING — DX DG NECK SOFT TISSUE
2 series · 2 of 2 positions shown · non-contrast
Comparison: None.

CLINICAL DATA: Initial evaluation for foreign body in throat.

EXAM:
NECK SOFT TISSUES - 1+ VIEW

[neck lat]
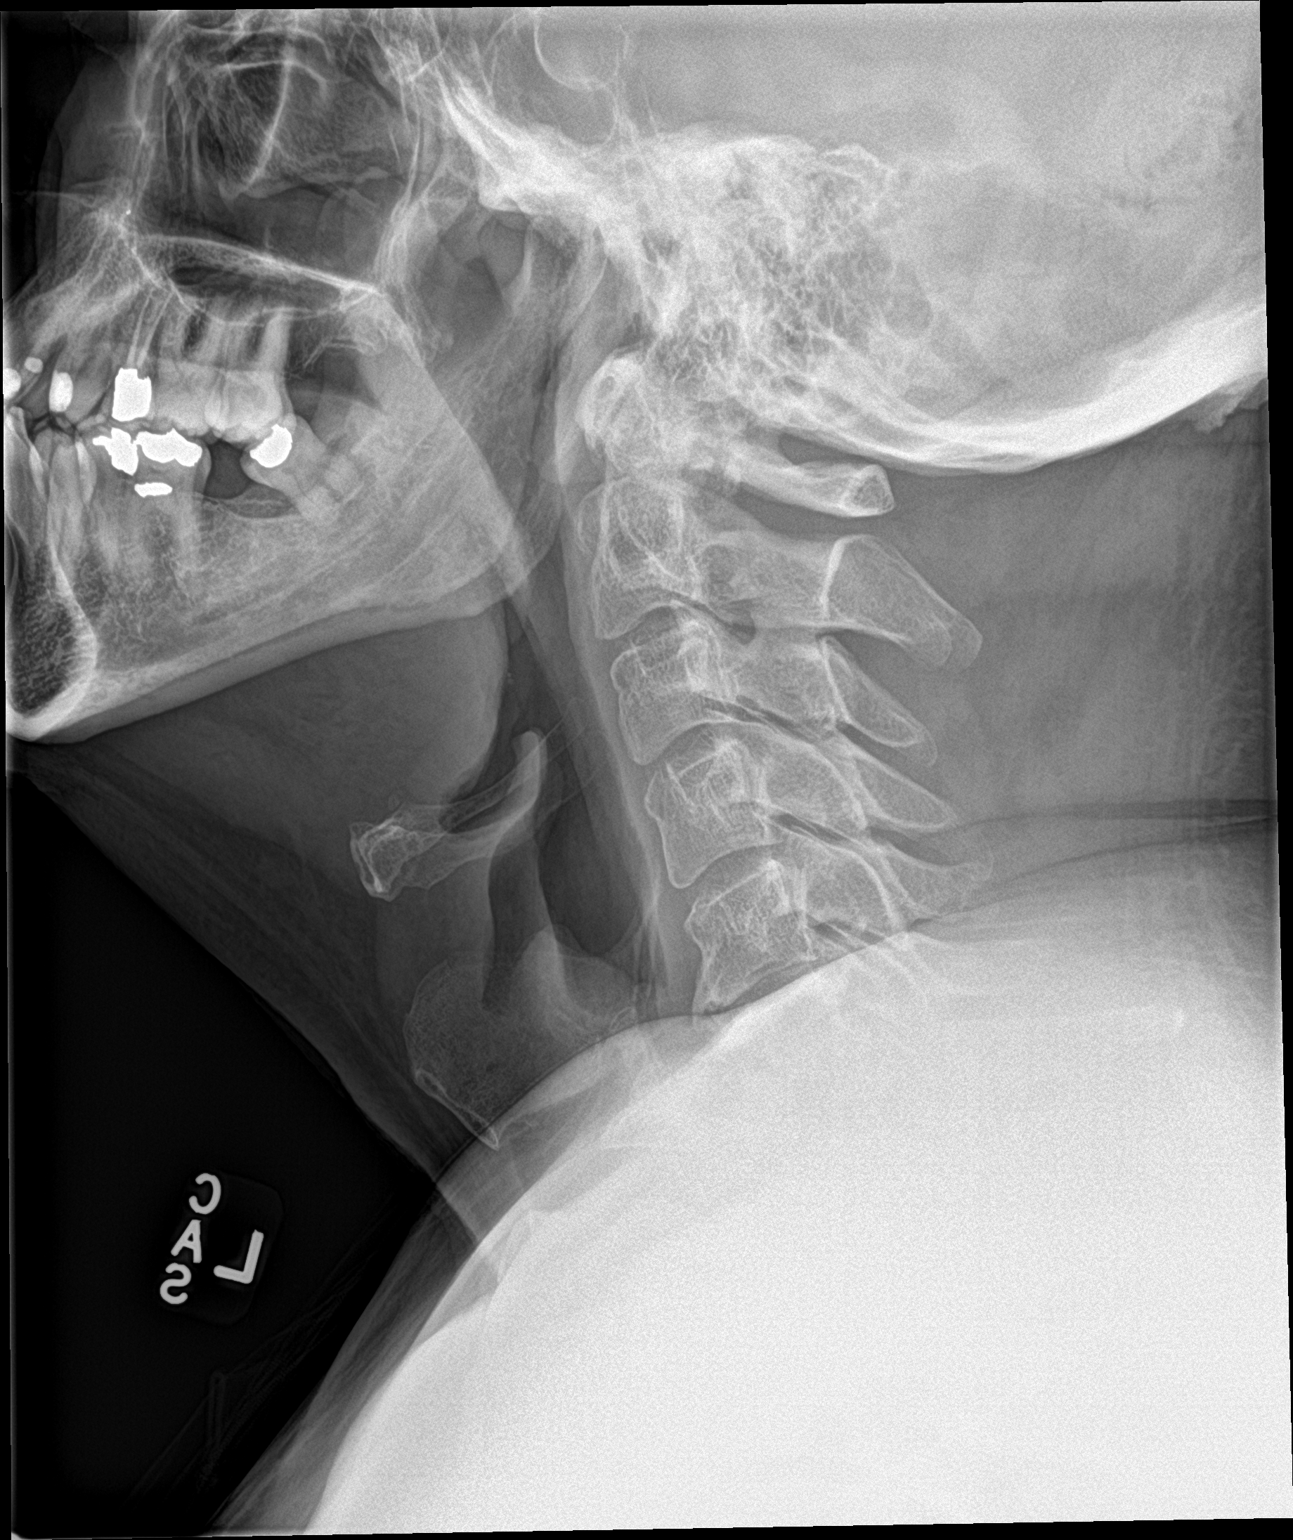

[neck ap]
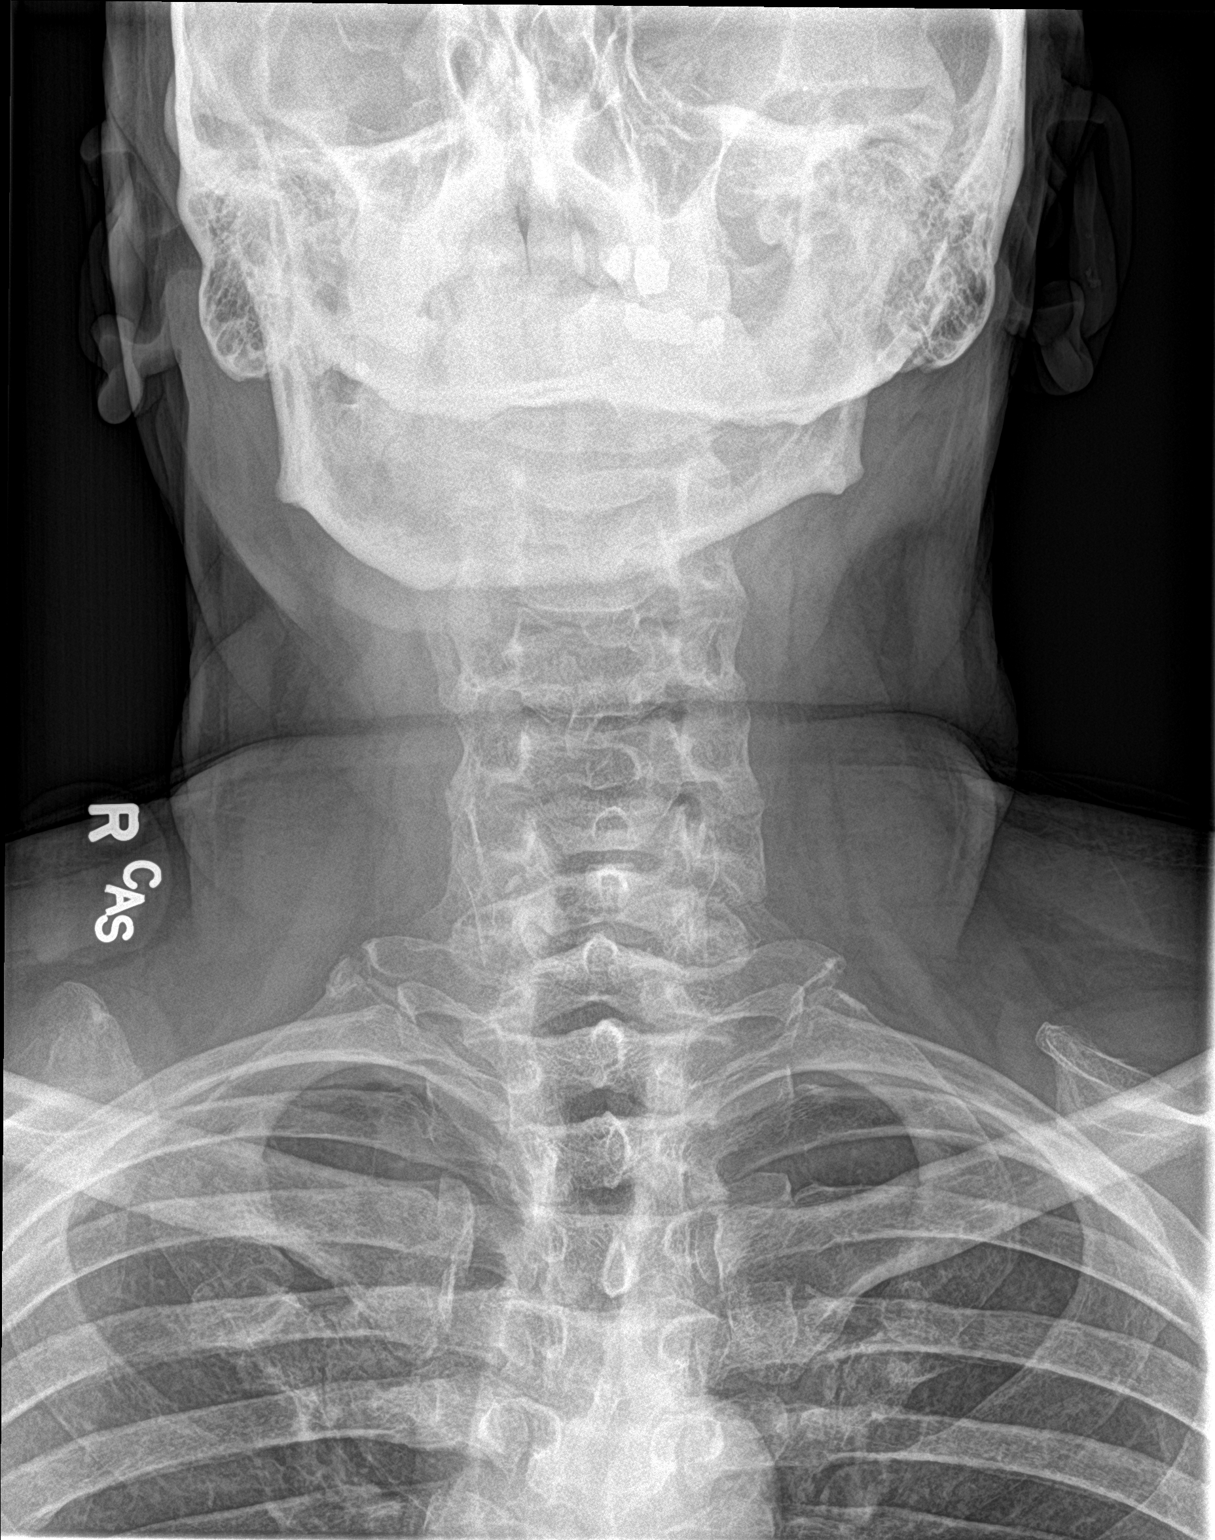

[2 of 2 positions shown; findings below may reference images not displayed]

FINDINGS: There is no evidence of retropharyngeal soft tissue swelling or
epiglottic enlargement. The cervical airway is unremarkable and no
radio-opaque foreign body identified. Degenerative spondylolysis
noted at C5-6.
IMPRESSION: Negative..  No radiopaque foreign body identified.

## 2019-07-10 IMAGING — DX DG CHEST 2V
2 series · 2 of 2 positions shown · non-contrast
Comparison: None.

CLINICAL DATA: Initial evaluation for possible foreign body in
throat.

EXAM:
CHEST - 2 VIEW

[chest pa]
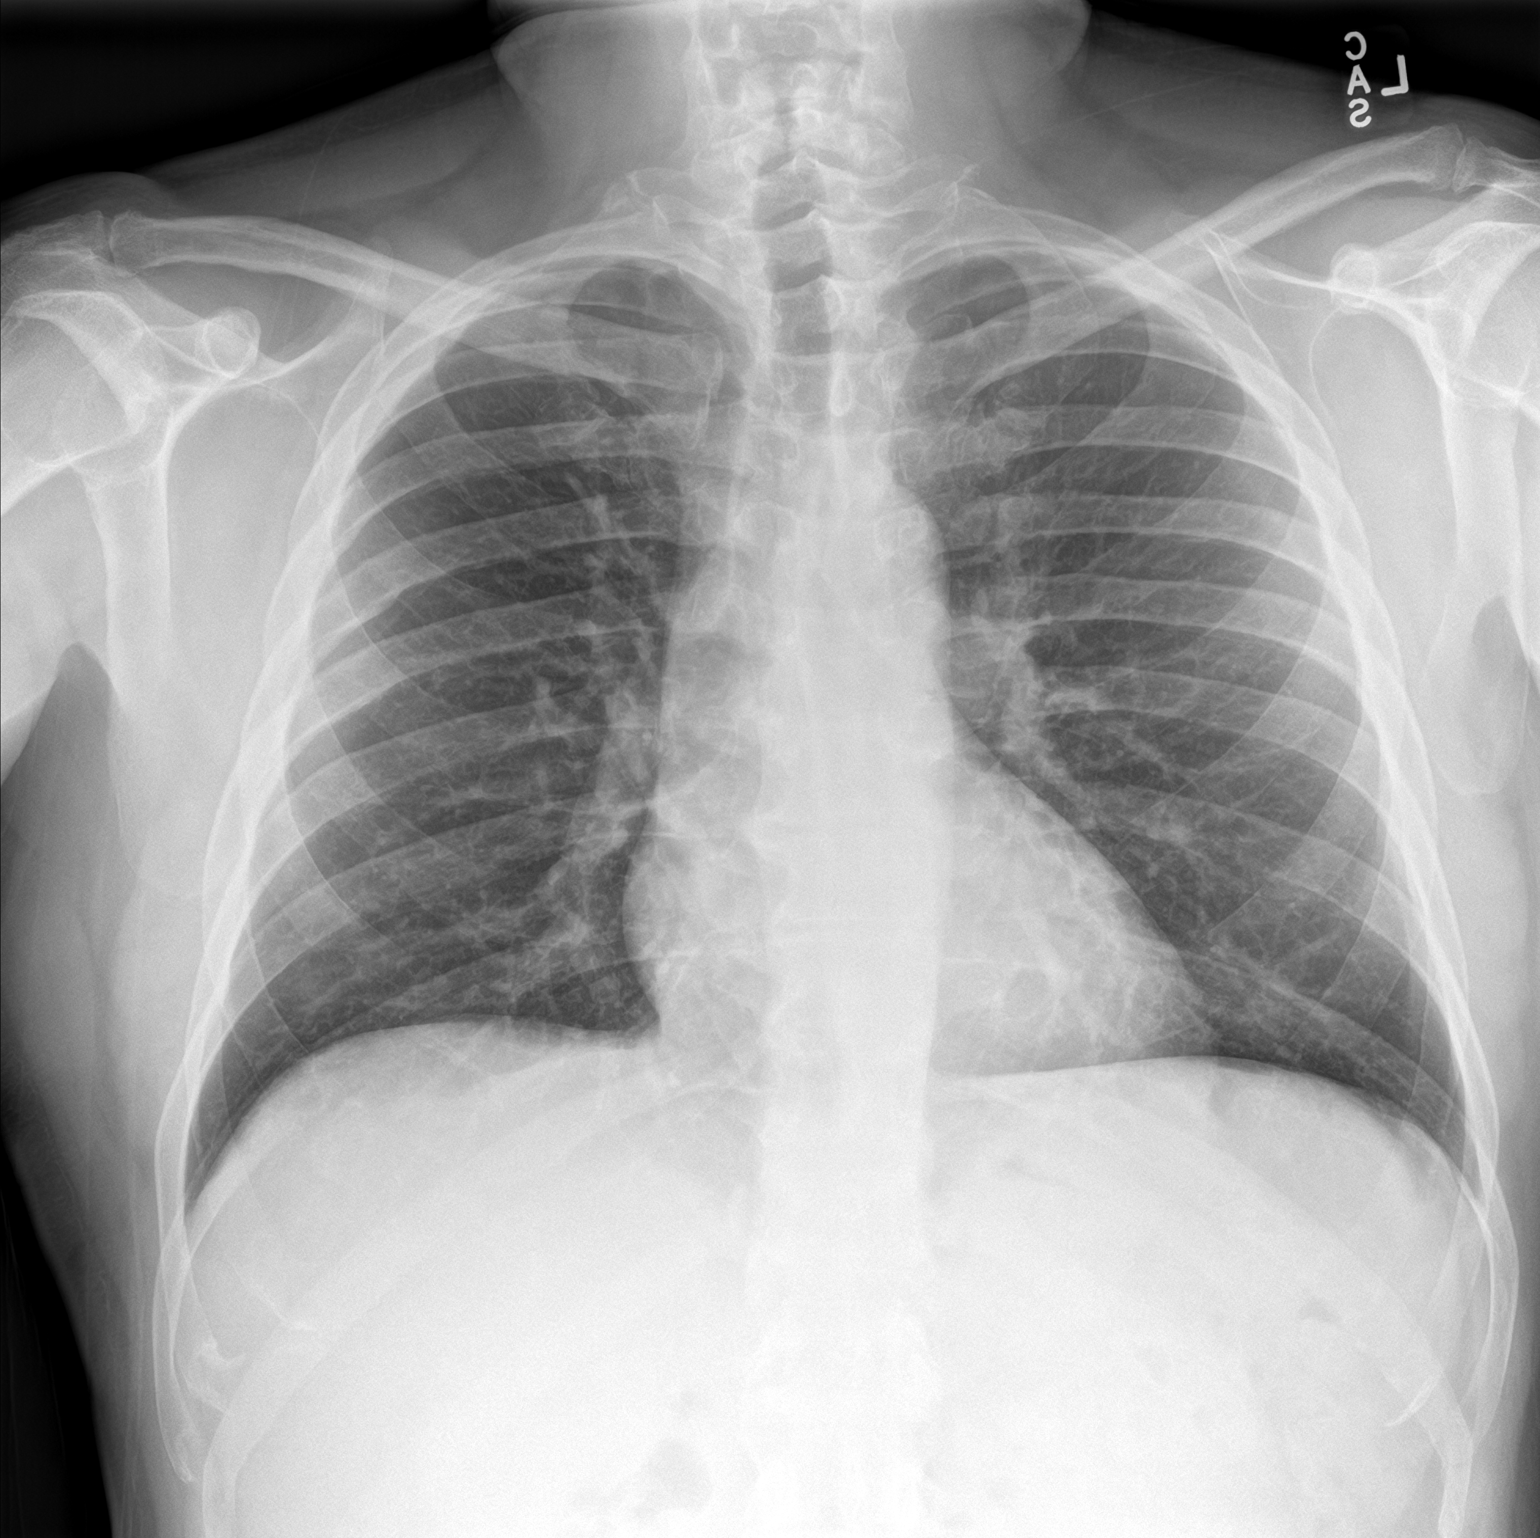

[chest lat]
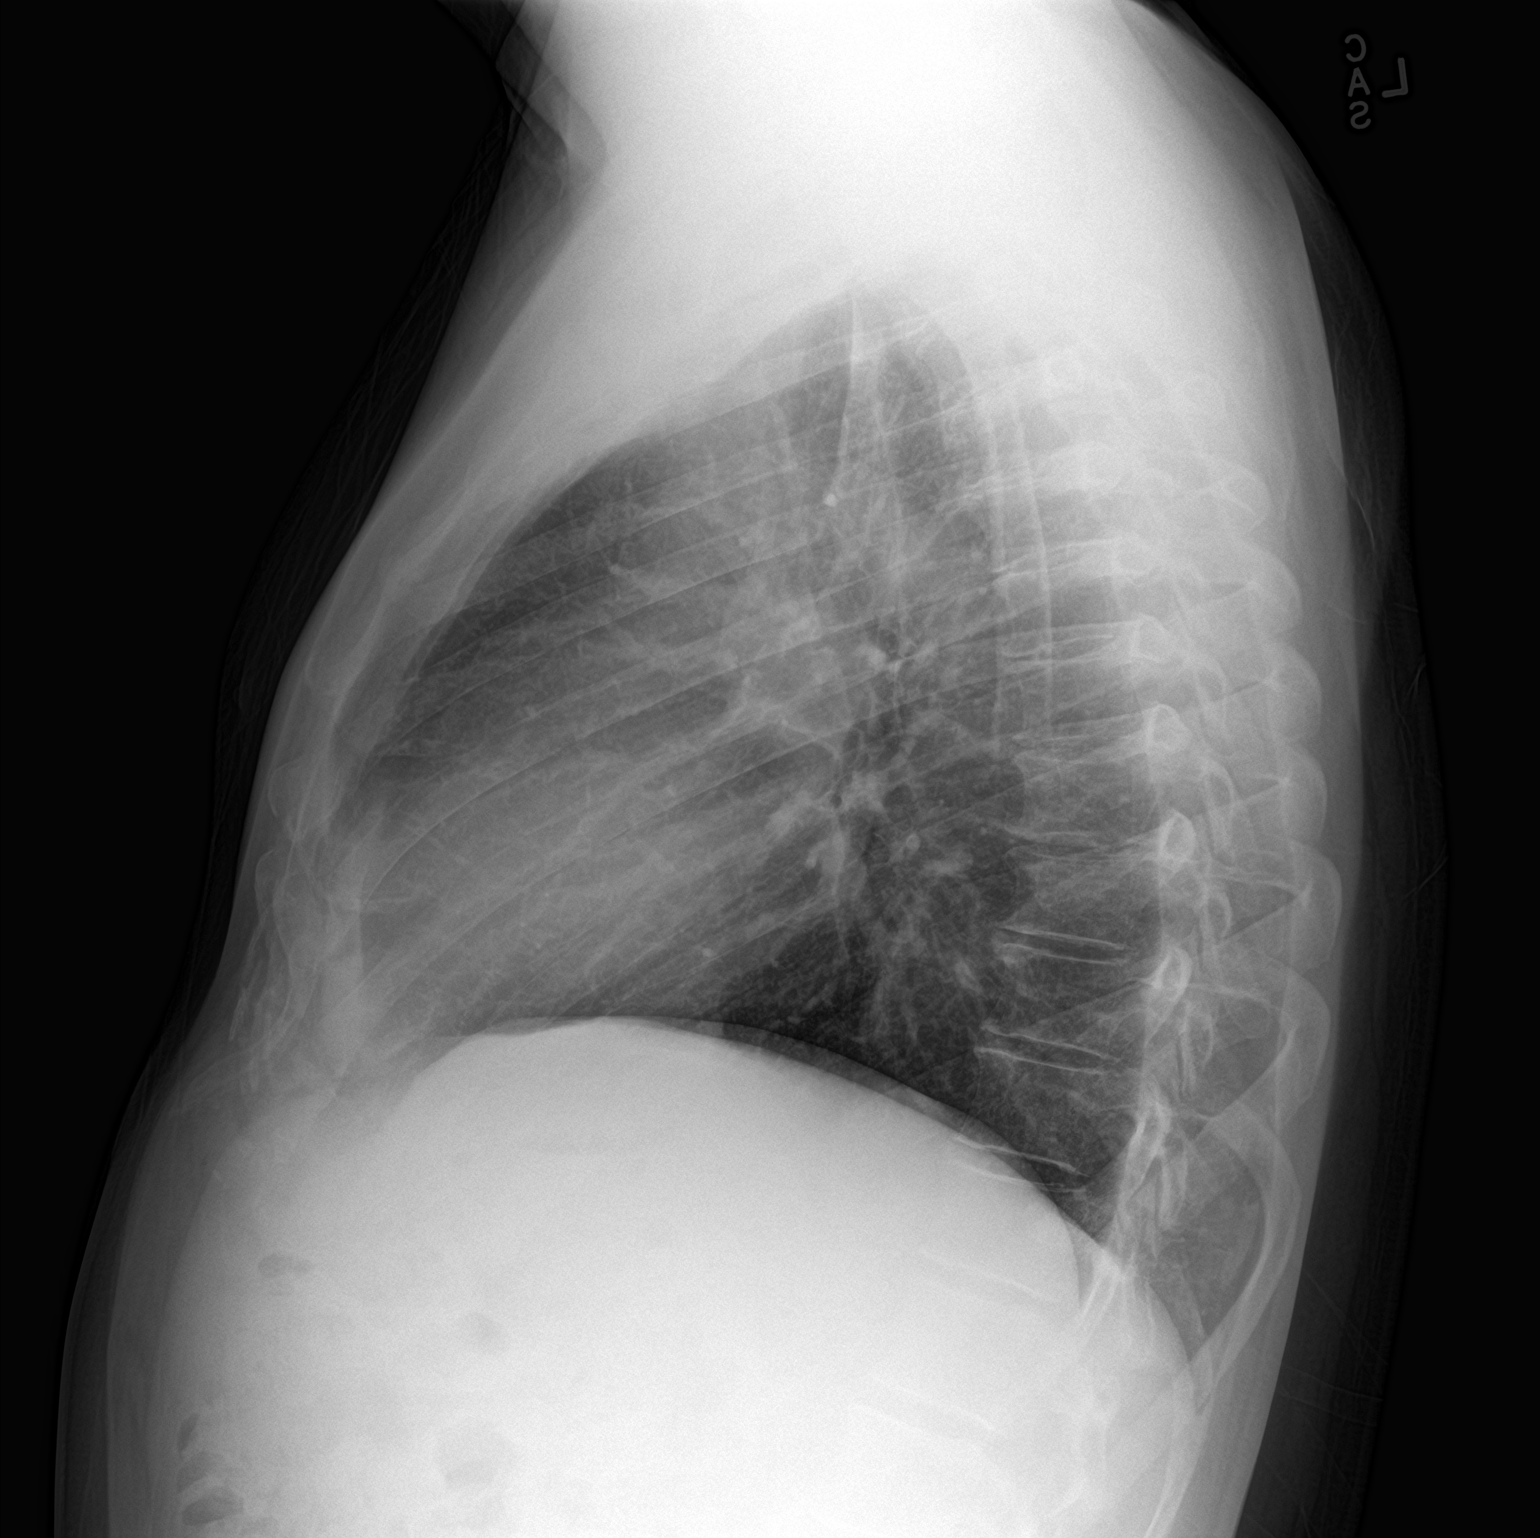

[2 of 2 positions shown; findings below may reference images not displayed]

FINDINGS: The cardiac and mediastinal silhouettes are stable in size and
contour, and remain within normal limits. No appreciable radiopaque
foreign body. No significant esophageal dilatation.

The lungs are normally inflated. No airspace consolidation, pleural
effusion, or pulmonary edema is identified. There is no
pneumothorax.

No acute osseous abnormality identified.
IMPRESSION: 1. No radiopaque foreign body seen within the thorax.
2. No other active cardiopulmonary disease.

## 2019-09-10 ENCOUNTER — Other Ambulatory Visit: Payer: Self-pay | Admitting: Gastroenterology

## 2019-09-10 DIAGNOSIS — K2100 Gastro-esophageal reflux disease with esophagitis, without bleeding: Secondary | ICD-10-CM

## 2020-03-09 ENCOUNTER — Encounter: Payer: Self-pay | Admitting: Internal Medicine

## 2020-07-18 ENCOUNTER — Ambulatory Visit: Payer: BC Managed Care – PPO | Admitting: Gastroenterology

## 2020-07-18 ENCOUNTER — Encounter: Payer: Self-pay | Admitting: Gastroenterology

## 2020-07-18 ENCOUNTER — Other Ambulatory Visit: Payer: Self-pay

## 2020-07-18 VITALS — BP 160/86 | HR 66 | Temp 97.5°F | Ht 72.0 in | Wt 204.4 lb

## 2020-07-18 DIAGNOSIS — K2100 Gastro-esophageal reflux disease with esophagitis, without bleeding: Secondary | ICD-10-CM

## 2020-07-18 MED ORDER — PANTOPRAZOLE SODIUM 40 MG PO TBEC: 40.0000 mg | DELAYED_RELEASE_TABLET | Freq: Every day | ORAL | 3 refills | Status: DC

## 2020-07-18 NOTE — Progress Notes (Signed)
90 days to [facility].

## 2020-07-18 NOTE — Progress Notes (Signed)
Primary Care Physician: Garald Braver  Primary Gastroenterologist:  Roetta Sessions, MD   Chief Complaint  Patient presents with  . Gastroesophageal Reflux    Doing ok    HPI: Eric Vaughn is a 60 y.o. male here for follow up of erosive/ulcerative reflux esophagitis.  Last seen in 2019.  He does have a history of food impactions.  He had a colonoscopy in January 2018 with diverticulosis.  No specimens collected.  Due for repeat colonoscopy January 2023 for family history of colon cancer.  Clinically doing well.  Has been able to get down to once daily pantoprazole.  Reflux is well controlled.  Denies any dysphagia.  No abdominal pain.  Bowel movements regular.  No blood in the stool or melena.  No unintentional weight loss.  Current Outpatient Medications  Medication Sig Dispense Refill  . aspirin 81 MG chewable tablet Chew by mouth daily.    . carvedilol (COREG) 25 MG tablet Take 25 mg by mouth 2 (two) times daily.    Marland Kitchen DILT-XR 120 MG 24 hr capsule Take 120 mg by mouth 2 (two) times daily.    . hydrochlorothiazide (HYDRODIURIL) 25 MG tablet Take 25 mg by mouth daily.    Marland Kitchen levothyroxine (SYNTHROID, LEVOTHROID) 25 MCG tablet Take 1 tablet by mouth daily.    . metFORMIN (GLUCOPHAGE) 500 MG tablet Take 1,000 mg by mouth 2 (two) times daily.    . Multiple Vitamin (MULTIVITAMIN) tablet Take 1 tablet by mouth daily.    Marland Kitchen olmesartan (BENICAR) 40 MG tablet Take 1 tablet by mouth daily.    . pantoprazole (PROTONIX) 40 MG tablet Take 1 tablet by mouth twice daily (Patient taking differently: Take 40 mg by mouth daily.) 180 tablet 1  . PARoxetine (PAXIL) 10 MG tablet Take 10 mg by mouth daily.     No current facility-administered medications for this visit.    Allergies as of 07/18/2020 - Review Complete 07/18/2020  Allergen Reaction Noted  . Penicillins Rash 12/13/2017  . Sulfa antibiotics Rash 12/13/2017    ROS:  General: Negative for anorexia, weight loss, fever,  chills, fatigue, weakness. ENT: Negative for hoarseness, difficulty swallowing , nasal congestion. CV: Negative for chest pain, angina, palpitations, dyspnea on exertion, peripheral edema.  Respiratory: Negative for dyspnea at rest, dyspnea on exertion, cough, sputum, wheezing.  GI: See history of present illness. GU:  Negative for dysuria, hematuria, urinary incontinence, urinary frequency, nocturnal urination.  Endo: Negative for unusual weight change.    Physical Examination:   BP (!) 160/86   Pulse 66   Temp (!) 97.5 F (36.4 C) (Temporal)   Ht 6' (1.829 m)   Wt 204 lb 6.4 oz (92.7 kg)   BMI 27.72 kg/m   General: Well-nourished, well-developed in no acute distress.  Eyes: No icterus. Mouth: masked.  Abdomen: Bowel sounds are normal, nontender, nondistended, no hepatosplenomegaly or masses, no abdominal bruits or hernia , no rebound or guarding.   Extremities: No lower extremity edema. No clubbing or deformities. Neuro: Alert and oriented x 4   Skin: Warm and dry, no jaundice.   Psych: Alert and cooperative, normal mood and affect.    Assessment/plan:  Pleasant 60 year old male with history of erosive/ulcerative reflux esophagitis with peptic stricture status post dilation, history of food impactions.  EGD in 2019.  Doing very well.  No recurrent reflux or dysphagia.  He continues pantoprazole 40 mg daily.  Family history of colon cancer.  Due colonoscopy January 2023.  1. Continue pantoprazole 40 mg daily. 2. Colonoscopy January 2023. NIC'd. 3. Return to the office in 2 years or call sooner if needed.

## 2020-07-18 NOTE — Patient Instructions (Signed)
Continue pantoprazole 40mg  once daily before breakfast for reflux.  You are due colonoscopy in 04/2021. We will send you a reminder closer to the date.   Return to the office in 2 years for follow up reflux.

## 2021-03-12 ENCOUNTER — Encounter: Payer: Self-pay | Admitting: *Deleted

## 2021-04-17 ENCOUNTER — Encounter: Payer: Self-pay | Admitting: *Deleted

## 2021-06-05 ENCOUNTER — Ambulatory Visit: Payer: BC Managed Care – PPO

## 2021-07-17 ENCOUNTER — Telehealth: Payer: Self-pay | Admitting: Internal Medicine

## 2021-07-17 NOTE — Telephone Encounter (Signed)
Does patient need NV or OV? Please call wife, she has questions. [phone] ?

## 2021-07-17 NOTE — Telephone Encounter (Signed)
Scheduled nurse visit by phone for 09/17/2021 at 11:00.  Wife wanted to know if Eric Vaughn was in network.  Informed her that we are in network.  She confirmed Dr. Luvenia Starch name was listed under her provider list.  Confirmed that he is at this practice and affiliated with [facility].  I did advise her to call her insurance company since she was adamant about not wanting to end up with a bill.  She informed me that she would before his nurse visit. ?

## 2021-09-03 ENCOUNTER — Other Ambulatory Visit: Payer: Self-pay | Admitting: Gastroenterology

## 2021-09-16 ENCOUNTER — Other Ambulatory Visit: Payer: Self-pay | Admitting: Gastroenterology

## 2021-09-17 ENCOUNTER — Ambulatory Visit: Payer: BC Managed Care – PPO

## 2022-03-06 ENCOUNTER — Other Ambulatory Visit: Payer: Self-pay | Admitting: Gastroenterology

## 2022-06-17 ENCOUNTER — Encounter: Payer: Self-pay | Admitting: Gastroenterology

## 2022-12-19 DIAGNOSIS — H16213 Exposure keratoconjunctivitis, bilateral: Secondary | ICD-10-CM | POA: Diagnosis not present
